# Patient Record
Sex: Male | Born: 1990 | Race: Black or African American | Hispanic: No | Marital: Single | Smoking: Never smoker
Health system: Southern US, Community
[De-identification: ages and names within clinical notes are randomized; demographics above are authoritative.]

## PROBLEM LIST (undated history)

## (undated) DIAGNOSIS — F909 Attention-deficit hyperactivity disorder, unspecified type: Secondary | ICD-10-CM

## (undated) DIAGNOSIS — E079 Disorder of thyroid, unspecified: Secondary | ICD-10-CM

---

## 2019-05-22 ENCOUNTER — Emergency Department (HOSPITAL_COMMUNITY)
Admission: EM | Admit: 2019-05-22 | Discharge: 2019-05-22 | Disposition: A | Discharge status: Home / Self Care | Attending: Emergency Medicine | Admitting: Emergency Medicine

## 2019-05-22 ENCOUNTER — Emergency Department (HOSPITAL_COMMUNITY)

## 2019-05-22 DIAGNOSIS — J029 Acute pharyngitis, unspecified: Secondary | ICD-10-CM | POA: Diagnosis present

## 2019-05-22 DIAGNOSIS — Z20828 Contact with and (suspected) exposure to other viral communicable diseases: Secondary | ICD-10-CM | POA: Diagnosis not present

## 2019-05-22 DIAGNOSIS — Z20822 Contact with and (suspected) exposure to covid-19: Secondary | ICD-10-CM

## 2019-05-22 LAB — COMPREHENSIVE METABOLIC PANEL
ALT: 24 U/L (ref 0–44)
AST: 33 U/L (ref 15–41)
Albumin: 4.1 g/dL (ref 3.5–5.0)
Alkaline Phosphatase: 118 U/L (ref 38–126)
Anion gap: 8 (ref 5–15)
BUN: 7 mg/dL (ref 6–20)
CO2: 26 mmol/L (ref 22–32)
Calcium: 9.2 mg/dL (ref 8.9–10.3)
Chloride: 106 mmol/L (ref 98–111)
Creatinine, Ser: 1.17 mg/dL (ref 0.61–1.24)
GFR calc Af Amer: >60 mL/min (ref >60)
GFR calc non Af Amer: >60 mL/min (ref >60)
Glucose, Bld: 90 mg/dL (ref 70–99)
Potassium: 4.1 mmol/L (ref 3.5–5.1)
Sodium: 140 mmol/L (ref 135–145)
Total Bilirubin: 0.9 mg/dL (ref 0.3–1.2)
Total Protein: 6.8 g/dL (ref 6.5–8.1)

## 2019-05-22 LAB — GROUP A STREP BY PCR: Group A Strep by PCR: NOT DETECTED

## 2019-05-22 LAB — CBC WITH DIFFERENTIAL/PLATELET
Abs Immature Granulocytes: 0.01 10*3/uL (ref 0.00–0.07)
Basophils Absolute: 0 10*3/uL (ref 0.0–0.1)
Basophils Relative: 1 %
Eosinophils Absolute: 0.1 10*3/uL (ref 0.0–0.5)
Eosinophils Relative: 1 %
HCT: 48.3 % (ref 39.0–52.0)
Hemoglobin: 15.3 g/dL (ref 13.0–17.0)
Immature Granulocytes: 0 %
Lymphocytes Relative: 51 %
Lymphs Abs: 2.7 10*3/uL (ref 0.7–4.0)
MCH: 30.5 pg (ref 26.0–34.0)
MCHC: 31.7 g/dL (ref 30.0–36.0)
MCV: 96.2 fL (ref 80.0–100.0)
Monocytes Absolute: 0.3 10*3/uL (ref 0.1–1.0)
Monocytes Relative: 5 %
Neutro Abs: 2.3 10*3/uL (ref 1.7–7.7)
Neutrophils Relative %: 42 %
Platelets: 231 10*3/uL (ref 150–400)
RBC: 5.02 MIL/uL (ref 4.22–5.81)
RDW: 13.2 % (ref 11.5–15.5)
WBC: 5.3 10*3/uL (ref 4.0–10.5)
nRBC: 0 % (ref 0.0–0.2)

## 2019-05-22 LAB — TSH: TSH: 1.229 u[IU]/mL (ref 0.350–4.500)

## 2019-05-22 MED ORDER — IOHEXOL 300 MG/ML  SOLN
75.0000 mL | Freq: Once | INTRAMUSCULAR | Status: AC | PRN
  Administered 2019-05-22: 75 mL via INTRAVENOUS

## 2019-05-22 NOTE — ED Provider Notes (Signed)
[facility] EMERGENCY DEPARTMENT Provider Note   CSN: [id number] Arrival date & time: 05/22/19  1101     History   Chief Complaint Chief Complaint  Patient presents with  . Thyroid Nodule    HPI Jason Dorsey is a 28 y.o. male.     HPI Patient presents with difficulty swallowing.  States she has a sore throat and feels the stuff gets caught in his mid throat.  States he has thyroid issues.  States he has been on thyroid medication.  No difficulty breathing.  No fevers or chills.  No weight loss.  No sick contacts.  Does not feel any lumps in his throat. No past medical history on file.  There are no active problems to display for this patient.         Home Medications    Prior to Admission medications   Not on File    Family History No family history on file.  Social History Social History   Tobacco Use  . Smoking status: Not on file  Substance Use Topics  . Alcohol use: Not on file  . Drug use: Not on file     Allergies   Patient has no known allergies.   Review of Systems Review of Systems  Constitutional: Negative for appetite change and unexpected weight change.  HENT: Positive for trouble swallowing. Negative for facial swelling.   Respiratory: Negative for shortness of breath.   Cardiovascular: Negative for chest pain.  Gastrointestinal: Negative for abdominal pain.  Genitourinary: Negative for flank pain.  Musculoskeletal: Negative for back pain.  Neurological: Negative for weakness.  Psychiatric/Behavioral: Negative for confusion.     Physical Exam Updated Vital Signs BP 132/80 (BP Location: Left Arm)   Pulse 82   Temp 98.3 F (36.8 C) (Oral)   Resp 14   SpO2 97%   Physical Exam Vitals signs and nursing note reviewed.  HENT:     Mouth/Throat:     Comments: Mild posterior pharyngeal erythema without exudate.  No swelling seen. Eyes:     Pupils: Pupils are equal, round, and reactive to light.  Neck:   Musculoskeletal: Neck supple. No muscular tenderness.     Comments: No stridor.  No mass felt. Cardiovascular:     Rate and Rhythm: Regular rhythm.  Pulmonary:     Breath sounds: Normal breath sounds. No stridor.  Abdominal:     Tenderness: There is no abdominal tenderness.  Musculoskeletal:     Right lower leg: No edema.     Left lower leg: No edema.  Lymphadenopathy:     Cervical: No cervical adenopathy.  Skin:    General: Skin is warm.     Capillary Refill: Capillary refill takes less than 2 seconds.  Neurological:     General: No focal deficit present.     Mental Status: He is alert.      ED Treatments / Results  Labs (all labs ordered are listed, but only abnormal results are displayed) Labs Reviewed  GROUP A STREP BY PCR  COMPREHENSIVE METABOLIC PANEL  CBC WITH DIFFERENTIAL/PLATELET  TSH    EKG None  Radiology No results found.  Procedures Procedures (including critical care time)  Medications Ordered in ED Medications - No data to display   Initial Impression / Assessment and Plan / ED Course  I have reviewed the triage vital signs and the nursing notes.  Pertinent labs & imaging results that were available during my care of the patient were reviewed by  me and considered in my medical decision making (see chart for details).        Patient with sore throat and difficulty swallowing.  CT scan and strep test pending.  Care turned over to Dr. Rhunette Croft.  Final Clinical Impressions(s) / ED Diagnoses   Final diagnoses:  None    ED Discharge Orders    None       Benjiman Core, MD 05/22/19 [phone]

## 2019-05-22 NOTE — Discharge Instructions (Addendum)
°  All the results in the ER are normal, labs and imaging. We are not sure what is causing your symptoms. The workup in the ER is not complete, and is limited to screening for life threatening and emergent conditions only, so please see a primary care doctor for further evaluation.  You are being tested for COVID-19.  Please read the instructions provided to prevent spread of the COVID-19.  You will be contacted if the COVID-19 results are positive.

## 2019-05-22 NOTE — ED Triage Notes (Signed)
Pt has thyroid nodule. Just got out of prison and takes meds every day. He feels like it is impeding his airway but pt can talk and breath without issue./

## 2019-05-23 LAB — SARS CORONAVIRUS 2 (TAT 6-24 HRS): SARS Coronavirus 2: NEGATIVE

## 2021-06-26 ENCOUNTER — Emergency Department (HOSPITAL_COMMUNITY)

## 2021-06-26 ENCOUNTER — Other Ambulatory Visit: Payer: Self-pay

## 2021-06-26 ENCOUNTER — Emergency Department (HOSPITAL_COMMUNITY)
Admission: EM | Admit: 2021-06-26 | Discharge: 2021-06-26 | Disposition: A | Discharge status: Home / Self Care | Attending: Emergency Medicine | Admitting: Emergency Medicine

## 2021-06-26 ENCOUNTER — Encounter (HOSPITAL_COMMUNITY): Payer: Self-pay | Admitting: Emergency Medicine

## 2021-06-26 DIAGNOSIS — Z20822 Contact with and (suspected) exposure to covid-19: Secondary | ICD-10-CM | POA: Diagnosis not present

## 2021-06-26 DIAGNOSIS — R079 Chest pain, unspecified: Secondary | ICD-10-CM | POA: Diagnosis present

## 2021-06-26 DIAGNOSIS — R002 Palpitations: Secondary | ICD-10-CM | POA: Diagnosis not present

## 2021-06-26 DIAGNOSIS — E039 Hypothyroidism, unspecified: Secondary | ICD-10-CM | POA: Insufficient documentation

## 2021-06-26 HISTORY — DX: Disorder of thyroid, unspecified: E07.9

## 2021-06-26 LAB — CBC
HCT: 46.7 % (ref 39.0–52.0)
Hemoglobin: 15.5 g/dL (ref 13.0–17.0)
MCH: 30.8 pg (ref 26.0–34.0)
MCHC: 33.2 g/dL (ref 30.0–36.0)
MCV: 92.7 fL (ref 80.0–100.0)
Platelets: 218 10*3/uL (ref 150–400)
RBC: 5.04 MIL/uL (ref 4.22–5.81)
RDW: 13.2 % (ref 11.5–15.5)
WBC: 4.4 10*3/uL (ref 4.0–10.5)
nRBC: 0 % (ref 0.0–0.2)

## 2021-06-26 LAB — BASIC METABOLIC PANEL
Anion gap: 7 (ref 5–15)
BUN: 11 mg/dL (ref 6–20)
CO2: 23 mmol/L (ref 22–32)
Calcium: 9.2 mg/dL (ref 8.9–10.3)
Chloride: 108 mmol/L (ref 98–111)
Creatinine, Ser: 0.98 mg/dL (ref 0.61–1.24)
GFR, Estimated: >60 mL/min (ref >60)
Glucose, Bld: 92 mg/dL (ref 70–99)
Potassium: 3.6 mmol/L (ref 3.5–5.1)
Sodium: 138 mmol/L (ref 135–145)

## 2021-06-26 LAB — RESP PANEL BY RT-PCR (FLU A&B, COVID) ARPGX2
Influenza A by PCR: NEGATIVE
Influenza B by PCR: NEGATIVE
SARS Coronavirus 2 by RT PCR: NEGATIVE

## 2021-06-26 LAB — TROPONIN I (HIGH SENSITIVITY)
Troponin I (High Sensitivity): 3 ng/L (ref <18)
Troponin I (High Sensitivity): 3 ng/L (ref <18)

## 2021-06-26 LAB — T4, FREE: Free T4: 1.08 ng/dL (ref 0.61–1.12)

## 2021-06-26 LAB — TSH: TSH: <0.01 u[IU]/mL — ABNORMAL LOW (ref 0.350–4.500)

## 2021-06-26 MED ORDER — NAPROXEN 500 MG PO TABS: 500.0000 mg | ORAL_TABLET | Freq: Two times a day (BID) | ORAL | 0 refills | Status: DC

## 2021-06-26 MED ORDER — NAPROXEN 250 MG PO TABS
500.0000 mg | ORAL_TABLET | Freq: Once | ORAL | Status: AC
  Administered 2021-06-26: 13:00:00 500 mg via ORAL
  Filled 2021-06-26: qty 2

## 2021-06-26 NOTE — ED Triage Notes (Signed)
C/o L sided chest pain and headache since last night.  Denies SOB, nausea, and vomiting.

## 2021-06-26 NOTE — ED Provider Notes (Signed)
Emergency Medicine Provider Triage Evaluation Note  Jason Dorsey , a 30 y.o. male  was evaluated in triage.  Pt complains of cp.  Review of Systems  Positive: Cp, heart palpitation Negative: Fever, cough, sob, n/v/d  Physical Exam  BP 135/82 (BP Location: Left Arm)   Pulse 82   Temp 98.9 F (37.2 C)   Resp 18   SpO2 96%  Gen:   Awake, no distress   Resp:  Normal effort  MSK:   Moves extremities without difficulty  Other:    Medical Decision Making  Medically screening exam initiated at 10:15 AM.  Appropriate orders placed.  Jason Dorsey was informed that the remainder of the evaluation will be completed by another provider, this initial triage assessment does not replace that evaluation, and the importance of remaining in the ED until their evaluation is complete.  Developed L sided chest pain and heart palpitation since last night.  Recently working out.  Hx of hyperthyroidism on methimazole.     Jason Helper, PA-C 06/26/21 1019    Gloris Manchester, MD 06/28/21 [phone]

## 2021-06-26 NOTE — ED Provider Notes (Signed)
[facility] EMERGENCY DEPARTMENT Provider Note   CSN: [id number] Arrival date & time: 06/26/21  1001     History Chief Complaint  Patient presents with   Chest Pain    Jason Dorsey is a 30 y.o. male with a past medical history significant for hypothyroidism who presents to the ED due to left-sided chest pain associated with palpitations that started around midnight last night.  Patient describes chest pain as a squeezing sensation worse with palpation and movement.  Denies associated shortness of breath, nausea, vomiting, diaphoresis.  No cardiac history.  Denies any drug use.  No cocaine use.  Patient describes palpitations as his heart racing more than normal.  Patient has been compliant with his hyperthyroidism medications.  Denies history of blood clots, recent surgeries, recent long immobilizations, and hormonal treatments.  No lower extremity edema.  Patient states prior to the onset of his chest pain, he was doing strenuous workouts.  He did numerous push-ups last night.  No injury to chest wall.  No treatment prior to arrival.  History obtained from patient and past medical records. No interpreter used during encounter.       Past Medical History:  Diagnosis Date   Thyroid disease     There are no problems to display for this patient.   History reviewed. No pertinent surgical history.     No family history on file.  Social History   Tobacco Use   Smoking status: Never   Smokeless tobacco: Never  Substance Use Topics   Alcohol use: Not Currently   Drug use: Not Currently    Home Medications Prior to Admission medications   Medication Sig Start Date End Date Taking? Authorizing Provider  naproxen (NAPROSYN) 500 MG tablet Take 1 tablet (500 mg total) by mouth 2 (two) times daily. 06/26/21  Yes Reily Treloar C, PA-C  ATENOLOL PO Take 1 tablet by mouth daily.    [provider]  METHIMAZOLE PO Take 8 tablets by mouth daily. Pt  states he takes 8 tablets daily.    [provider]    Allergies    Patient has no known allergies.  Review of Systems   Review of Systems  Constitutional:  Negative for chills and fever.  Respiratory:  Negative for shortness of breath.   Cardiovascular:  Positive for chest pain and palpitations. Negative for leg swelling.  Gastrointestinal:  Negative for abdominal pain, diarrhea, nausea and vomiting.  All other systems reviewed and are negative.  Physical Exam Updated Vital Signs BP (!) 147/93 (BP Location: Left Arm)   Pulse 81   Temp 98.8 F (37.1 C)   Resp 19   SpO2 98%   Physical Exam Vitals and nursing note reviewed.  Constitutional:      General: He is not in acute distress.    Appearance: He is not ill-appearing.  HENT:     Head: Normocephalic.  Eyes:     Pupils: Pupils are equal, round, and reactive to light.  Cardiovascular:     Rate and Rhythm: Normal rate and regular rhythm.     Pulses: Normal pulses.     Heart sounds: Normal heart sounds. No murmur heard.   No friction rub. No gallop.  Pulmonary:     Effort: Pulmonary effort is normal.     Breath sounds: Normal breath sounds.  Chest:     Comments: Reproducible tenderness throughout left pectoral muscle. Abdominal:     General: Abdomen is flat. There is no distension.  Palpations: Abdomen is soft.     Tenderness: There is no abdominal tenderness. There is no guarding or rebound.  Musculoskeletal:        General: Normal range of motion.     Cervical back: Neck supple.     Comments: No lower extremity edema. Negative homan sign bilaterally.  Skin:    General: Skin is warm and dry.  Neurological:     General: No focal deficit present.     Mental Status: He is alert.  Psychiatric:        Mood and Affect: Mood normal.        Behavior: Behavior normal.    ED Results / Procedures / Treatments   Labs (all labs ordered are listed, but only abnormal results are displayed) Labs Reviewed  TSH  - Abnormal; Notable for the following components:      Result Value   TSH <0.010 (*)    All other components within normal limits  RESP PANEL BY RT-PCR (FLU A&B, COVID) ARPGX2  BASIC METABOLIC PANEL  CBC  T4, FREE  TROPONIN I (HIGH SENSITIVITY)  TROPONIN I (HIGH SENSITIVITY)    EKG EKG Interpretation  Date/Time:  Sunday June 26 2021 10:01:51 EST Ventricular Rate:  92 PR Interval:  160 QRS Duration: 84 QT Interval:  348 QTC Calculation: 430 R Axis:   91 Text Interpretation: Normal sinus rhythm with sinus arrhythmia Rightward axis Nonspecific T wave abnormality Abnormal ECG No previous ECGs available Confirmed by Lawsing, James ([id number]) on 06/26/2021 1:39:17 PM  Radiology DG Chest 2 View  Result Date: 06/26/2021 CLINICAL DATA:  Chest pain and headache since last night EXAM: CHEST - 2 VIEW COMPARISON:  None. FINDINGS: The heart size and mediastinal contours are within normal limits. Both lungs are clear. The visualized skeletal structures are unremarkable. IMPRESSION: No active cardiopulmonary disease. Electronically Signed   By: Jonathan  Watts M.D.   On: 06/26/2021 11:13    Procedures Procedures   Medications Ordered in ED Medications  naproxen (NAPROSYN) tablet 500 mg (500 mg Oral Given 06/26/21 1254)    ED Course  I have reviewed the triage vital signs and the nursing notes.  Pertinent labs & imaging results that were available during my care of the patient were reviewed by me and considered in my medical decision making (see chart for details).  Clinical Course as of 06/26/21 1347  Sun Jun 26, 2021  1344 T4,Free(Direct): 1.08 [CA]    Clinical Course User Index [CA] Virat Prather C, PA-C   MDM Rules/Calculators/A&P                           30  year old male presents to the ED due to left-sided chest pain associated with palpitations that started around midnight last night.  History of hyperthyroidism which he has been compliant with his medications.  No  cardiac history.  Upon arrival, vitals all within normal limits.  Patient in no acute distress.  Physical exam significant for reproducible tenderness throughout left pectoral muscle.  No lower extremity edema.  PERC negative and low risk using Wells criteria.  Low suspicion for PE/DVT.  Possible MSK etiology given reproducible nature.  Cardiac labs ordered at triage to rule out ACS.  Chest pain appears atypical in nature.  Delta troponin flat.  EKG demonstrates normal sinus rhythm with nonspecific T wave abnormalities.  Low suspicion for ACS.  CBC unremarkable.  BMP unremarkable.  TSH slightly low.  Normal free T4.  COVID/influenza  negative.  Chest x-ray personally reviewed which is negative for signs of pneumonia, pneumothorax, or widened mediastinum.  On reassessment, patient notes improvement in chest pain after naproxen.  Suspect MSK etiology.  PERC negative and low risk using Wells criteria.  Low suspicion for PE/DVT.  Presentation nonconcerning for dissection.  Palpitations could be related to hyperthyroidism.  [facility] number given to patient at discharge and advised to call to establish care on Monday.  Patient stable for discharge. Strict ED precautions discussed with patient. Patient states understanding and agrees to plan. Patient discharged home in no acute distress and stable vitals  Final Clinical Impression(s) / ED Diagnoses Final diagnoses:  Nonspecific chest pain  Palpitations    Rx / DC Orders ED Discharge Orders          Ordered    naproxen (NAPROSYN) 500 MG tablet  2 times daily        06/26/21 1341             Mannie Stabile, [place] 06/26/21 1347    Ernie Avena, MD 06/26/21 1759

## 2021-06-26 NOTE — Discharge Instructions (Addendum)
It was a pleasure taking care of you today. As discussed, your TSH was low, meaning you may need more thyroid medication which is most likely causing the palpitations. All of your cardiac labs were normal. I have included the number for [facility]. Call to schedule an appointment for further evaluation of your thyroid. Your chest pain is most likely musculoskeletal in nature. I am sending you home with pain medication. Take as needed. Return to the ER for new or worsening symptoms.

## 2021-12-24 ENCOUNTER — Emergency Department (HOSPITAL_COMMUNITY)
Admission: EM | Admit: 2021-12-24 | Discharge: 2021-12-24 | Disposition: A | Discharge status: Home / Self Care | Payer: Self-pay | Attending: Emergency Medicine | Admitting: Emergency Medicine

## 2021-12-24 ENCOUNTER — Other Ambulatory Visit: Payer: Self-pay

## 2021-12-24 ENCOUNTER — Encounter (HOSPITAL_COMMUNITY): Payer: Self-pay | Admitting: Emergency Medicine

## 2021-12-24 ENCOUNTER — Emergency Department (HOSPITAL_COMMUNITY): Payer: Self-pay

## 2021-12-24 DIAGNOSIS — S80812A Abrasion, left lower leg, initial encounter: Secondary | ICD-10-CM | POA: Insufficient documentation

## 2021-12-24 DIAGNOSIS — Y9372 Activity, wrestling: Secondary | ICD-10-CM | POA: Insufficient documentation

## 2021-12-24 DIAGNOSIS — M25512 Pain in left shoulder: Secondary | ICD-10-CM | POA: Insufficient documentation

## 2021-12-24 DIAGNOSIS — S80811A Abrasion, right lower leg, initial encounter: Secondary | ICD-10-CM | POA: Insufficient documentation

## 2021-12-24 DIAGNOSIS — W500XXA Accidental hit or strike by another person, initial encounter: Secondary | ICD-10-CM | POA: Insufficient documentation

## 2021-12-24 DIAGNOSIS — S4992XA Unspecified injury of left shoulder and upper arm, initial encounter: Secondary | ICD-10-CM | POA: Insufficient documentation

## 2021-12-24 HISTORY — DX: Attention-deficit hyperactivity disorder, unspecified type: F90.9

## 2021-12-24 MED ORDER — MELOXICAM 7.5 MG PO TABS: 7.5000 mg | ORAL_TABLET | Freq: Every day | ORAL | 0 refills | Status: AC

## 2021-12-24 MED ORDER — KETOROLAC TROMETHAMINE 15 MG/ML IJ SOLN
15.0000 mg | Freq: Once | INTRAMUSCULAR | Status: AC
  Administered 2021-12-24: 15 mg via INTRAMUSCULAR
  Filled 2021-12-24: qty 1

## 2021-12-24 NOTE — Discharge Instructions (Signed)
Please read and follow all provided instructions.  Your diagnoses today include:  1. Acute pain of left shoulder    Tests performed today include: An x-ray of the affected area - does NOT show any broken bones Vital signs. See below for your results today.   Medications prescribed:  Meloxicam - anti-inflammatory pain medication  You have been prescribed an anti-inflammatory medication or NSAID. Take with food. Do not take aspirin, ibuprofen, or naproxen if taking this medication. Take smallest effective dose for the shortest duration needed for your pain. Stop taking if you experience stomach pain or vomiting.   Take any prescribed medications only as directed.  Home care instructions:  Follow any educational materials contained in this packet Follow R.I.C.E. Protocol: R - rest your injury  I  - use ice on injury without applying directly to skin C - compress injury with bandage or splint E - elevate the injury as much as possible  Follow-up instructions: Please follow-up with your primary care provider or the provided orthopedic physician (bone specialist) if you continue to have significant pain in 1 week. In this case you may have a more severe injury that requires further care.   Return instructions:  Please return if your fingers are numb or tingling, appear gray or blue, or you have severe pain (also elevate the arm and loosen splint or wrap if you were given one) Please return to the Emergency Department if you experience worsening symptoms.  Please return if you have any other emergent concerns.  Additional Information:  Your vital signs today were: BP (!) 132/97   Pulse (!) 105   Temp 98.3 F (36.8 C)   Resp 18   SpO2 98%  If your blood pressure (BP) was elevated above 135/85 this visit, please have this repeated by your doctor within one month. --------------

## 2021-12-24 NOTE — ED Triage Notes (Signed)
C/o L shoulder pain.  States he believes it is dislocated.  Fell while running last night.

## 2021-12-24 NOTE — ED Provider Notes (Signed)
[facility] EMERGENCY DEPARTMENT Provider Note   CSN: [id number] Arrival date & time: 12/24/21  1410     History  Chief Complaint  Patient presents with   Shoulder Pain    Jason Dorsey is a 31 y.o. male.  Patient presents to the emergency department for evaluation of left shoulder pain.  Patient states that he was wrestling with some of his cousins last night and rolled and fell onto his left shoulder.  He has had pain in the shoulder and pain with movement since that time.  No distal numbness or tingling.  No neck pain.  No lower extremity symptoms other than abrasions to the lower legs.  States that he had difficulty sleeping last night and the family member told him to come to the emergency department to get it checked out.  No treatments prior to arrival.      Home Medications Prior to Admission medications   Medication Sig Start Date End Date Taking? Authorizing Provider  meloxicam (MOBIC) 7.5 MG tablet Take 1 tablet (7.5 mg total) by mouth daily. 12/24/21  Yes Renne Crigler, PA-C  ATENOLOL PO Take 1 tablet by mouth daily.    [provider]  METHIMAZOLE PO Take 8 tablets by mouth daily. Pt states he takes 8 tablets daily.    [provider]      Allergies    Patient has no known allergies.    Review of Systems   Review of Systems  Physical Exam Updated Vital Signs BP (!) 132/97   Pulse (!) 105   Temp 98.3 F (36.8 C)   Resp 18   SpO2 98%   Physical Exam Vitals and nursing note reviewed.  Constitutional:      Appearance: He is well-developed.  HENT:     Head: Normocephalic and atraumatic.  Eyes:     Conjunctiva/sclera: Conjunctivae normal.  Cardiovascular:     Pulses: Normal pulses. No decreased pulses.  Musculoskeletal:        General: Tenderness present.     Right shoulder: No tenderness. Normal range of motion.     Left shoulder: Tenderness and bony tenderness present. Decreased range of motion.     Left elbow:  Normal range of motion. No tenderness.     Cervical back: Normal range of motion and neck supple. No tenderness. Normal range of motion.     Thoracic back: No tenderness. Normal range of motion.     Right lower leg: No edema.     Left lower leg: No edema.     Comments: Patient with tenderness over the superior aspect of the shoulder as well as anteriorly and laterally.  No tenderness over the paraspinous musculature of the cervical or thoracic spine.  Patient has difficulty with abduction of the left shoulder due to pain.  Skin:    General: Skin is warm and dry.     Comments: Numerous superficial linear abrasions to the bilateral lower extremities below the knee.  Patient has a couple of Band-Aids on the right lower extremity.  Neurological:     Mental Status: He is alert.     Sensory: No sensory deficit.     Comments: Motor, sensation, and vascular distal to the injury is fully intact.   Psychiatric:        Mood and Affect: Mood normal.    ED Results / Procedures / Treatments   Labs (all labs ordered are listed, but only abnormal results are displayed) Labs Reviewed - No data  to display  EKG None  Radiology DG Shoulder Left  Result Date: 12/24/2021 CLINICAL DATA:  Larey Seat, left shoulder pain EXAM: LEFT SHOULDER - 2+ VIEW COMPARISON:  None Available. FINDINGS: Internal rotation, external rotation, and transscapular views are obtained. No acute fracture, subluxation, or dislocation. Joint spaces are well preserved. Left chest is clear. IMPRESSION: 1. Unremarkable left shoulder. Electronically Signed   By: Sharlet Salina M.D.   On: 12/24/2021 15:04    Procedures Procedures    Medications Ordered in ED Medications  ketorolac (TORADOL) 15 MG/ML injection 15 mg (has no administration in time range)    ED Course/ Medical Decision Making/ A&P    Patient seen and examined. History obtained directly from patient. Work-up including labs, imaging, EKG ordered in triage, if performed, were  reviewed.    Labs/EKG: None ordered  Imaging: X-ray of the shoulder, agree negative no dislocation.  Medications/Fluids: Ordered: IM Toradol  Most recent vital signs reviewed and are as follows: BP (!) 132/97   Pulse (!) 105   Temp 98.3 F (36.8 C)   Resp 18   SpO2 98%   Initial impression: Left shoulder injury, shoulder pain.  No dislocation   Home treatment plan: RICE protocol, NSAIDs, sling for comfort  Return instructions discussed with patient: Worsening pain, numbness  Follow-up instructions discussed with patient: Routine care over the holiday weekend, follow-up with orthopedic physician if not improving into next week.  Patient is given a referral.                          Medical Decision Making Amount and/or Complexity of Data Reviewed Radiology: ordered.  Risk Prescription drug management.   Patient with left shoulder pain after fall.  Suspect contusion/sprain at this point.  Considered dislocation or fracture of the humerus or scapula.  X-ray does not suggest these.  Do not suspect any significant neurovascular injury at this point.        Final Clinical Impression(s) / ED Diagnoses Final diagnoses:  Acute pain of left shoulder    Rx / DC Orders ED Discharge Orders          Ordered    meloxicam (MOBIC) 7.5 MG tablet  Daily        12/24/21 1518              Renne Crigler, PA-C 12/24/21 1523    Glynn Octave, MD 12/24/21 2141

## 2021-12-24 NOTE — ED Notes (Signed)
Patient transported to X-ray 

## 2022-10-24 IMAGING — CR DG SHOULDER 2+V*L*
3 series · 3 of 3 positions shown · non-contrast
Comparison: None Available.

CLINICAL DATA: Fell, left shoulder pain

EXAM:
LEFT SHOULDER - 2+ VIEW

[shoulder y view]
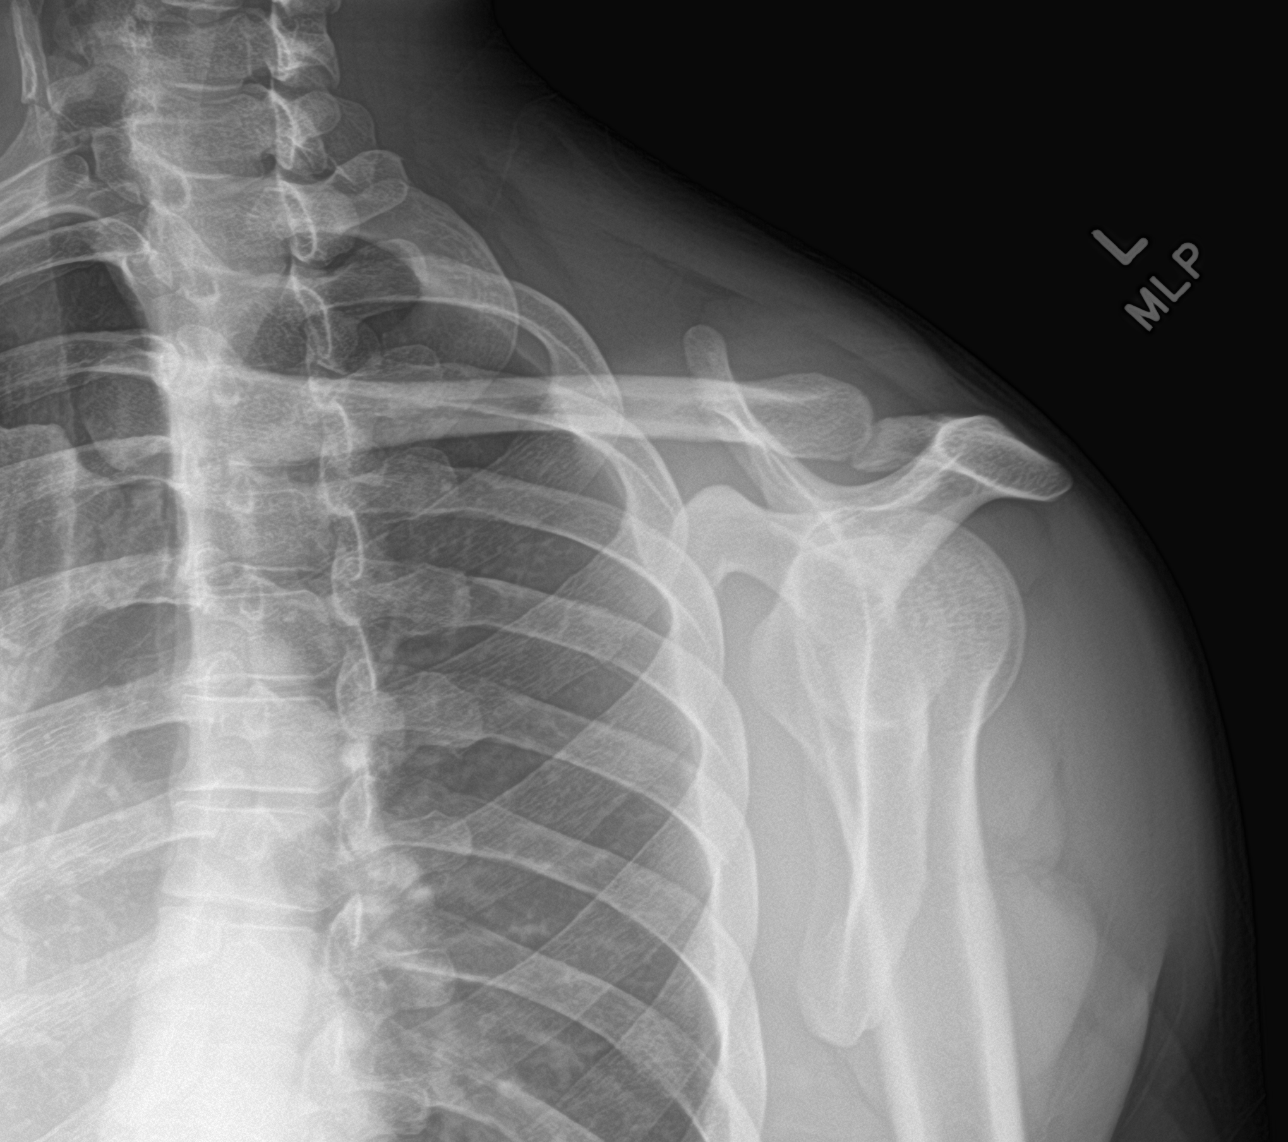

[shoulder ap neutral]
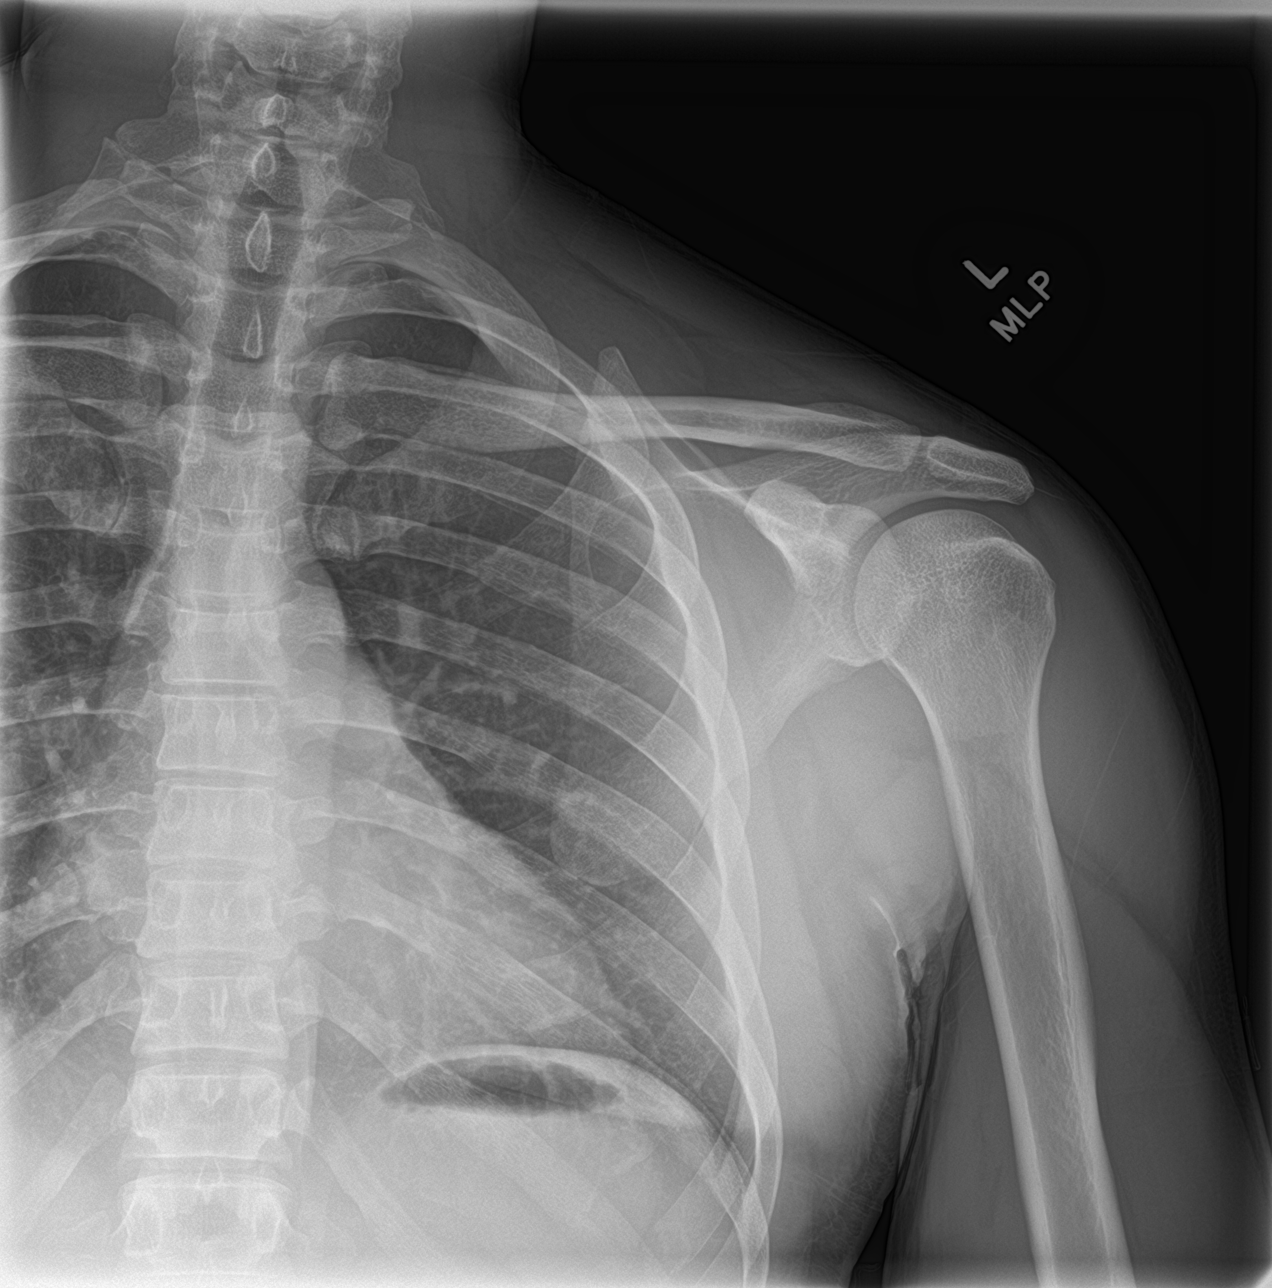

[shoulder grashey]
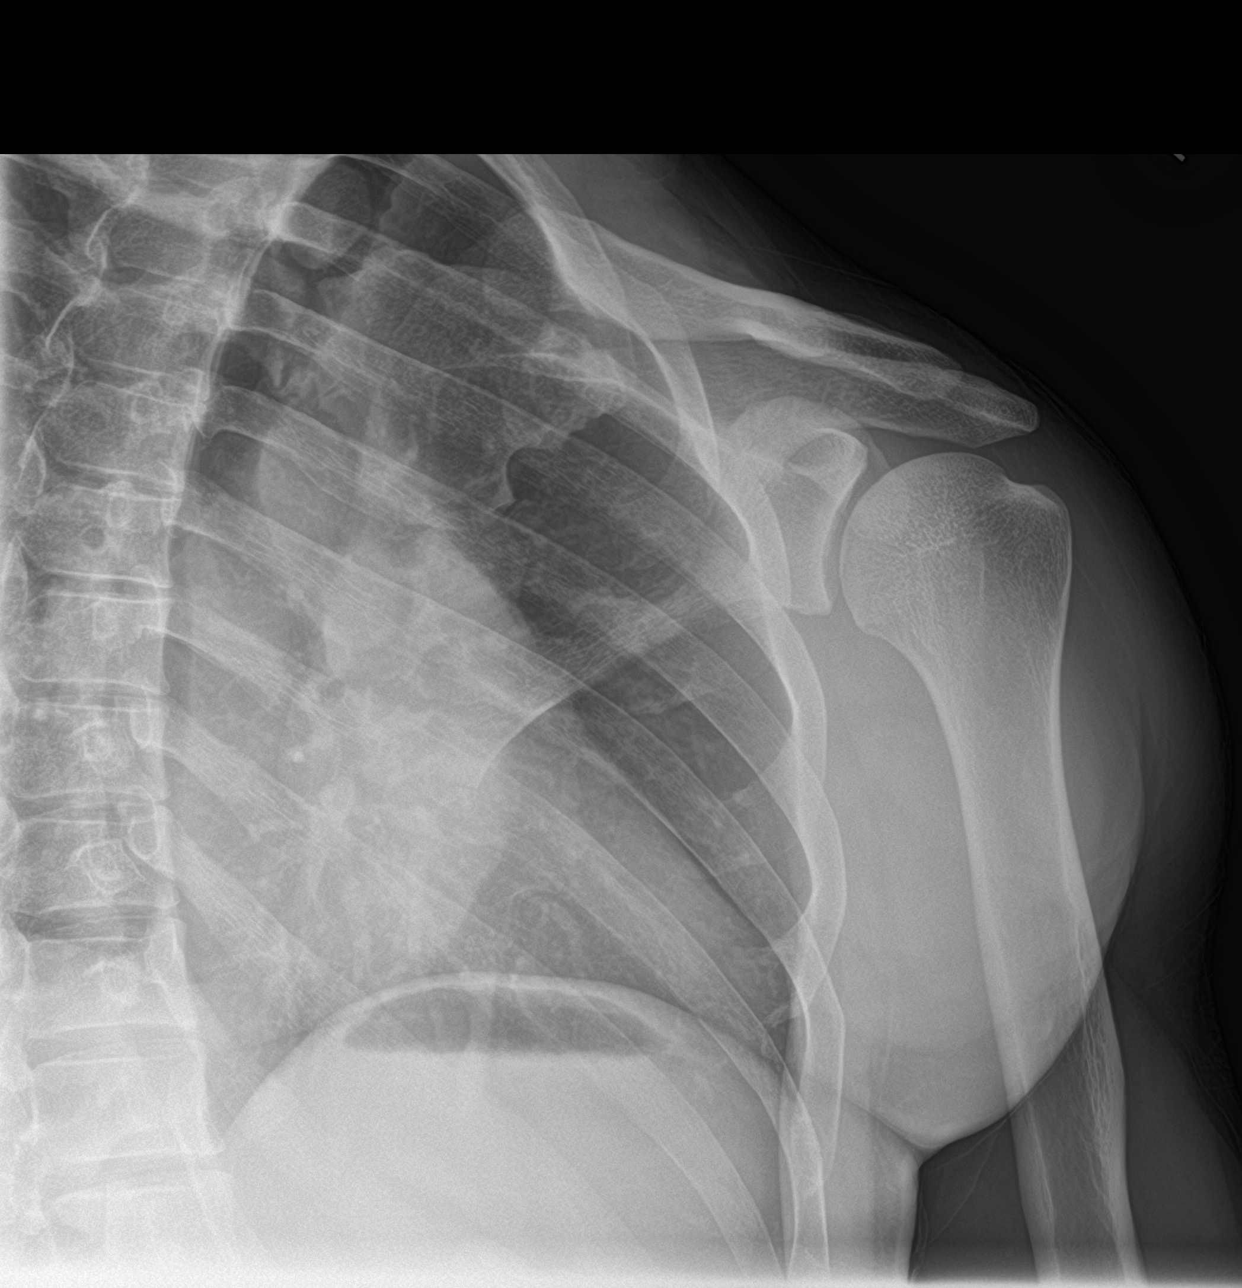

[3 of 3 positions shown; findings below may reference images not displayed]

FINDINGS: Internal rotation, external rotation, and transscapular views are
obtained. No acute fracture, subluxation, or dislocation. Joint
spaces are well preserved. Left chest is clear.
IMPRESSION: 1. Unremarkable left shoulder.
